# Patient Record
Sex: Female | Born: 2000 | Race: Black or African American | Hispanic: No | Marital: Single | Smoking: Never smoker
Health system: Southern US, Community
[De-identification: ages and names within clinical notes are randomized; demographics above are authoritative.]

## PROBLEM LIST (undated history)

## (undated) DIAGNOSIS — B9689 Other specified bacterial agents as the cause of diseases classified elsewhere: Secondary | ICD-10-CM

## (undated) DIAGNOSIS — N760 Acute vaginitis: Secondary | ICD-10-CM

---
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
## 2020-02-25 DIAGNOSIS — F319 Bipolar disorder, unspecified: Secondary | ICD-10-CM | POA: Diagnosis not present

## 2020-03-03 ENCOUNTER — Emergency Department (HOSPITAL_COMMUNITY)
Admission: EM | Admit: 2020-03-03 | Discharge: 2020-03-03 | Disposition: A | Discharge status: Home / Self Care | Payer: Medicaid Other | Attending: Emergency Medicine | Admitting: Emergency Medicine

## 2020-03-03 ENCOUNTER — Encounter (HOSPITAL_COMMUNITY): Payer: Self-pay | Admitting: *Deleted

## 2020-03-03 ENCOUNTER — Other Ambulatory Visit: Payer: Self-pay

## 2020-03-03 DIAGNOSIS — Z5321 Procedure and treatment not carried out due to patient leaving prior to being seen by health care provider: Secondary | ICD-10-CM | POA: Diagnosis not present

## 2020-03-03 DIAGNOSIS — R42 Dizziness and giddiness: Secondary | ICD-10-CM | POA: Insufficient documentation

## 2020-03-03 LAB — CBC
HCT: 38 % (ref 36.0–46.0)
Hemoglobin: 11.9 g/dL — ABNORMAL LOW (ref 12.0–15.0)
MCH: 29.3 pg (ref 26.0–34.0)
MCHC: 31.3 g/dL (ref 30.0–36.0)
MCV: 93.6 fL (ref 80.0–100.0)
Platelets: 293 10*3/uL (ref 150–400)
RBC: 4.06 MIL/uL (ref 3.87–5.11)
RDW: 13.9 % (ref 11.5–15.5)
WBC: 4.8 10*3/uL (ref 4.0–10.5)
nRBC: 0 % (ref 0.0–0.2)

## 2020-03-03 LAB — URINALYSIS, ROUTINE W REFLEX MICROSCOPIC
Bilirubin Urine: NEGATIVE
Glucose, UA: NEGATIVE mg/dL
Hgb urine dipstick: NEGATIVE
Ketones, ur: NEGATIVE mg/dL
Leukocytes,Ua: NEGATIVE
Nitrite: NEGATIVE
Protein, ur: NEGATIVE mg/dL
Specific Gravity, Urine: 1.023 (ref 1.005–1.030)
pH: 8 (ref 5.0–8.0)

## 2020-03-03 LAB — BASIC METABOLIC PANEL
Anion gap: 10 (ref 5–15)
BUN: 11 mg/dL (ref 6–20)
CO2: 25 mmol/L (ref 22–32)
Calcium: 9.1 mg/dL (ref 8.9–10.3)
Chloride: 100 mmol/L (ref 98–111)
Creatinine, Ser: 0.95 mg/dL (ref 0.44–1.00)
GFR calc Af Amer: >60 mL/min (ref >60)
GFR calc non Af Amer: >60 mL/min (ref >60)
Glucose, Bld: 94 mg/dL (ref 70–99)
Potassium: 4.3 mmol/L (ref 3.5–5.1)
Sodium: 135 mmol/L (ref 135–145)

## 2020-03-03 LAB — I-STAT BETA HCG BLOOD, ED (MC, WL, AP ONLY): I-stat hCG, quantitative: <5 m[IU]/mL (ref <5)

## 2020-03-03 MED ORDER — SODIUM CHLORIDE 0.9% FLUSH: 3.0000 mL | Freq: Once | INTRAVENOUS | Status: DC

## 2020-03-03 NOTE — ED Triage Notes (Signed)
Pt reports dizziness on and off, headache and upper abdominal pain for about a month.

## 2020-03-29 DIAGNOSIS — F319 Bipolar disorder, unspecified: Secondary | ICD-10-CM | POA: Diagnosis not present

## 2020-04-11 ENCOUNTER — Emergency Department (HOSPITAL_COMMUNITY): Payer: Medicaid Other

## 2020-04-11 ENCOUNTER — Other Ambulatory Visit: Payer: Self-pay

## 2020-04-11 ENCOUNTER — Emergency Department (HOSPITAL_COMMUNITY)
Admission: EM | Admit: 2020-04-11 | Discharge: 2020-04-11 | Disposition: A | Discharge status: Home / Self Care | Payer: Medicaid Other | Attending: Emergency Medicine | Admitting: Emergency Medicine

## 2020-04-11 ENCOUNTER — Encounter (HOSPITAL_COMMUNITY): Payer: Self-pay | Admitting: Emergency Medicine

## 2020-04-11 DIAGNOSIS — Y999 Unspecified external cause status: Secondary | ICD-10-CM | POA: Insufficient documentation

## 2020-04-11 DIAGNOSIS — S199XXA Unspecified injury of neck, initial encounter: Secondary | ICD-10-CM | POA: Diagnosis not present

## 2020-04-11 DIAGNOSIS — Y929 Unspecified place or not applicable: Secondary | ICD-10-CM | POA: Insufficient documentation

## 2020-04-11 DIAGNOSIS — F17210 Nicotine dependence, cigarettes, uncomplicated: Secondary | ICD-10-CM | POA: Insufficient documentation

## 2020-04-11 DIAGNOSIS — M542 Cervicalgia: Secondary | ICD-10-CM | POA: Diagnosis not present

## 2020-04-11 DIAGNOSIS — M545 Low back pain: Secondary | ICD-10-CM | POA: Insufficient documentation

## 2020-04-11 DIAGNOSIS — R52 Pain, unspecified: Secondary | ICD-10-CM | POA: Diagnosis not present

## 2020-04-11 DIAGNOSIS — S3992XA Unspecified injury of lower back, initial encounter: Secondary | ICD-10-CM | POA: Diagnosis not present

## 2020-04-11 DIAGNOSIS — Y939 Activity, unspecified: Secondary | ICD-10-CM | POA: Diagnosis not present

## 2020-04-11 DIAGNOSIS — M549 Dorsalgia, unspecified: Secondary | ICD-10-CM

## 2020-04-11 DIAGNOSIS — M5489 Other dorsalgia: Secondary | ICD-10-CM | POA: Diagnosis not present

## 2020-04-11 LAB — I-STAT BETA HCG BLOOD, ED (MC, WL, AP ONLY): I-stat hCG, quantitative: <5 m[IU]/mL (ref <5)

## 2020-04-11 MED ORDER — METHOCARBAMOL 500 MG PO TABS: 500.0000 mg | ORAL_TABLET | Freq: Two times a day (BID) | ORAL | 0 refills | Status: DC

## 2020-04-11 MED ORDER — METHOCARBAMOL 500 MG PO TABS
500.0000 mg | ORAL_TABLET | Freq: Once | ORAL | Status: AC
  Administered 2020-04-11: 500 mg via ORAL
  Filled 2020-04-11: qty 1

## 2020-04-11 MED ORDER — NAPROXEN 250 MG PO TABS
500.0000 mg | ORAL_TABLET | Freq: Once | ORAL | Status: AC
  Administered 2020-04-11: 500 mg via ORAL
  Filled 2020-04-11: qty 2

## 2020-04-11 MED ORDER — NAPROXEN 500 MG PO TABS: 500.0000 mg | ORAL_TABLET | Freq: Two times a day (BID) | ORAL | 0 refills | Status: DC
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
## 2020-04-11 NOTE — Discharge Instructions (Addendum)
Take the prescribed medication as directed.  Can try using heat on the neck and back to help with soreness. Follow-up with your primary care doctor. Return to the ED for new or worsening symptoms.

## 2020-05-26 ENCOUNTER — Emergency Department (HOSPITAL_COMMUNITY)
Admission: EM | Admit: 2020-05-26 | Discharge: 2020-05-26 | Disposition: A | Discharge status: Home / Self Care | Payer: Medicaid Other | Attending: Emergency Medicine | Admitting: Emergency Medicine

## 2020-05-26 ENCOUNTER — Other Ambulatory Visit: Payer: Self-pay

## 2020-05-26 ENCOUNTER — Encounter (HOSPITAL_COMMUNITY): Payer: Self-pay | Admitting: Obstetrics and Gynecology

## 2020-05-26 DIAGNOSIS — R1084 Generalized abdominal pain: Secondary | ICD-10-CM

## 2020-05-26 DIAGNOSIS — R112 Nausea with vomiting, unspecified: Secondary | ICD-10-CM | POA: Diagnosis not present

## 2020-05-26 DIAGNOSIS — F17200 Nicotine dependence, unspecified, uncomplicated: Secondary | ICD-10-CM | POA: Insufficient documentation

## 2020-05-26 DIAGNOSIS — K920 Hematemesis: Secondary | ICD-10-CM | POA: Diagnosis present

## 2020-05-26 DIAGNOSIS — R1013 Epigastric pain: Secondary | ICD-10-CM | POA: Diagnosis not present

## 2020-05-26 LAB — URINALYSIS, ROUTINE W REFLEX MICROSCOPIC
Bilirubin Urine: NEGATIVE
Glucose, UA: NEGATIVE mg/dL
Ketones, ur: NEGATIVE mg/dL
Nitrite: NEGATIVE
Protein, ur: NEGATIVE mg/dL
RBC / HPF: >50 RBC/hpf — ABNORMAL HIGH (ref 0–5)
Specific Gravity, Urine: 1.015 (ref 1.005–1.030)
pH: 5 (ref 5.0–8.0)

## 2020-05-26 LAB — COMPREHENSIVE METABOLIC PANEL
ALT: 9 U/L (ref 0–44)
AST: 18 U/L (ref 15–41)
Albumin: 4.6 g/dL (ref 3.5–5.0)
Alkaline Phosphatase: 58 U/L (ref 38–126)
Anion gap: 10 (ref 5–15)
BUN: 10 mg/dL (ref 6–20)
CO2: 25 mmol/L (ref 22–32)
Calcium: 9.7 mg/dL (ref 8.9–10.3)
Chloride: 106 mmol/L (ref 98–111)
Creatinine, Ser: 0.68 mg/dL (ref 0.44–1.00)
GFR calc Af Amer: >60 mL/min (ref >60)
GFR calc non Af Amer: >60 mL/min (ref >60)
Glucose, Bld: 85 mg/dL (ref 70–99)
Potassium: 4.2 mmol/L (ref 3.5–5.1)
Sodium: 141 mmol/L (ref 135–145)
Total Bilirubin: 0.6 mg/dL (ref 0.3–1.2)
Total Protein: 7.3 g/dL (ref 6.5–8.1)

## 2020-05-26 LAB — CBC
HCT: 37.4 % (ref 36.0–46.0)
Hemoglobin: 12 g/dL (ref 12.0–15.0)
MCH: 29.7 pg (ref 26.0–34.0)
MCHC: 32.1 g/dL (ref 30.0–36.0)
MCV: 92.6 fL (ref 80.0–100.0)
Platelets: 336 10*3/uL (ref 150–400)
RBC: 4.04 MIL/uL (ref 3.87–5.11)
RDW: 12.9 % (ref 11.5–15.5)
WBC: 9.5 10*3/uL (ref 4.0–10.5)
nRBC: 0 % (ref 0.0–0.2)

## 2020-05-26 LAB — I-STAT BETA HCG BLOOD, ED (MC, WL, AP ONLY): I-stat hCG, quantitative: <5 m[IU]/mL (ref <5)

## 2020-05-26 LAB — LIPASE, BLOOD: Lipase: 20 U/L (ref 11–51)

## 2020-05-26 MED ORDER — DICYCLOMINE HCL 20 MG PO TABS: 20.0000 mg | ORAL_TABLET | Freq: Two times a day (BID) | ORAL | 0 refills | Status: DC

## 2020-05-26 MED ORDER — SODIUM CHLORIDE 0.9% FLUSH: 3.0000 mL | Freq: Once | INTRAVENOUS | Status: DC

## 2020-05-26 MED ORDER — PANTOPRAZOLE SODIUM 40 MG PO TBEC
40.0000 mg | DELAYED_RELEASE_TABLET | Freq: Every day | ORAL | Status: DC
  Administered 2020-05-26: 40 mg via ORAL
  Filled 2020-05-26: qty 1

## 2020-05-26 MED ORDER — DICYCLOMINE HCL 10 MG PO CAPS
10.0000 mg | ORAL_CAPSULE | Freq: Once | ORAL | Status: AC
  Administered 2020-05-26: 10 mg via ORAL
  Filled 2020-05-26: qty 1

## 2020-05-26 MED ORDER — OMEPRAZOLE 20 MG PO CPDR: 20.0000 mg | DELAYED_RELEASE_CAPSULE | Freq: Every day | ORAL | 0 refills | Status: DC

## 2020-05-26 NOTE — ED Triage Notes (Signed)
Patient reports to the ER for c/o near syncope at work yesterday and emesis with blood. Patient reports she got very dizzy yesterday and went to sit down and vomited dark red. Patient denies diarrhea.

## 2020-05-26 NOTE — ED Notes (Signed)
Pt discharged from this ED in stable condition at this time. All discharge instructions and follow up care reviewed with pt with no further questions at this time. Pt ambulatory with steady gait, clear speech.  

## 2020-05-26 NOTE — ED Provider Notes (Signed)
[facility]-EMERGENCY DEPT Provider Note   CSN: [id number] Arrival date & time: 05/26/20  0849     History Chief Complaint  Patient presents with  . Emesis  . Near Syncope    Eileen Gray is a 18 y.o. female without significant PMHx, presenting to the ED with complaint of epigastric abdominal pain that began last night when she was at work, 11pm-12am. Pain became severe and made her feel like she was going to pass out. She reports associated vomiting with the pain, emesis looked dark red but she didn't get a good look at it. Today, the pain persists, described as cramping pain in her lower abdomen that comes and goes. BM yesterday was slightly more loose than usual, though normal brown color. Reports normal appetite. Denies fever, chills, urinary sx, hx of abdominal surgeries. Pt endorses daily marijuana use. Denies alcohol use, other drugs, or frequent NSAID use.   The history is provided by the patient.       Past Medical History:  Diagnosis Date  . BV (bacterial vaginosis)     There are no problems to display for this patient.   History reviewed. No pertinent surgical history.   OB History   No obstetric history on file.     No family history on file.  Social History   Tobacco Use  . Smoking status: Current Every Day Smoker  . Smokeless tobacco: Never Used  Substance Use Topics  . Alcohol use: Not Currently  . Drug use: Yes    Types: Marijuana    Home Medications Prior to Admission medications   Medication Sig Start Date End Date Taking? Authorizing Provider  dicyclomine (BENTYL) 20 MG tablet Take 1 tablet (20 mg total) by mouth 2 (two) times daily. 05/26/20   Zaylie Gisler, [place] N, PA-C  methocarbamol (ROBAXIN) 500 MG tablet Take 1 tablet (500 mg total) by mouth 2 (two) times daily. 04/11/20   Garlon Hatchet, PA-C  metroNIDAZOLE (METROGEL) 0.75 % vaginal gel Place 1 Applicatorful vaginally 2 (two) times daily. 06/05/18   Shon Hale, MD  naproxen (NAPROSYN) 500 MG tablet Take 1 tablet (500 mg total) by mouth 2 (two) times daily with a meal. 04/11/20   Garlon Hatchet, PA-C  omeprazole (PRILOSEC) 20 MG capsule Take 1 capsule (20 mg total) by mouth daily. 05/26/20   Freddye Cardamone, [place] N, PA-C  ondansetron (ZOFRAN) 4 MG tablet Take 1 tablet (4 mg total) by mouth every 8 (eight) hours as needed for nausea or vomiting. 12/24/18   Jeanmarie Plant, MD    Allergies    Patient has no known allergies.  Review of Systems   Review of Systems  All other systems reviewed and are negative.   Physical Exam Updated Vital Signs BP 116/82 (BP Location: Right Arm)   Pulse 64   Temp 98.5 F (36.9 C) (Oral)   Resp 20   LMP 05/26/2020 (Exact Date)   SpO2 100%   Physical Exam Vitals and nursing note reviewed.  Constitutional:      General: She is not in acute distress.    Appearance: She is well-developed. She is not ill-appearing.  HENT:     Head: Normocephalic and atraumatic.  Eyes:     Conjunctiva/sclera: Conjunctivae normal.  Cardiovascular:     Rate and Rhythm: Normal rate and regular rhythm.  Pulmonary:     Effort: Pulmonary effort is normal. No respiratory distress.     Breath sounds: Normal breath sounds.  Abdominal:  General: Bowel sounds are normal.     Palpations: Abdomen is soft.     Tenderness: There is generalized abdominal tenderness. There is no guarding or rebound.  Skin:    General: Skin is warm.  Neurological:     Mental Status: She is alert.  Psychiatric:        Behavior: Behavior normal.     ED Results / Procedures / Treatments   Labs (all labs ordered are listed, but only abnormal results are displayed) Labs Reviewed  URINALYSIS, ROUTINE W REFLEX MICROSCOPIC - Abnormal; Notable for the following components:      Result Value   APPearance HAZY (*)    Hgb urine dipstick LARGE (*)    Leukocytes,Ua TRACE (*)    RBC / HPF >50 (*)    Bacteria, UA RARE (*)    All other components within  normal limits  LIPASE, BLOOD  COMPREHENSIVE METABOLIC PANEL  CBC  I-STAT BETA HCG BLOOD, ED ([facility], [facility], [facility] ONLY)    EKG None  Radiology No results found.  Procedures Procedures (including critical care time)  Medications Ordered in ED Medications  dicyclomine (BENTYL) capsule 10 mg (10 mg Oral Given 05/26/20 1655)    ED Course  I have reviewed the triage vital signs and the nursing notes.  Pertinent labs & imaging results that were available during my care of the patient were reviewed by me and considered in my medical decision making (see chart for details).    MDM Rules/Calculators/A&P                          Patient presenting with 2 episodes of hematemesis that occurred late last night with abdominal cramping.  She has had no further episodes, and is having some waves of abdominal cramping today.  Abdomen has generalized tenderness, no guarding or rebound is present.  She is very well-appearing and does not appear to be in distress.  Vital signs are normal, afebrile.  Labs are also very reassuring, no leukocytosis or anemia.  Metabolic panel is normal.  hCG is negative.  Urine appears to have some contamination though patient has no symptoms for urinary tract infection, do not leave treatment of asymptomatic bacteriuria is indicated at this time.  Discussed possibility of small Mallory-Weiss tear in the setting of the vomiting and retching, versus gastritis versus PUD versus gastroenteritis.  She is given PPI, Bentyl for symptoms,, tolerating p.o. will discharge with symptomatic management, PPI, instructions to follow closely with with PCP.  She is given strict return precautions regarding persistent bleeding, or worsening pain.  Patient is agreeable to plan, well-appearing, safe for discharge.  Final Clinical Impression(s) / ED Diagnoses Final diagnoses:  Generalized abdominal pain  Hematemesis with nausea    Rx / DC Orders ED Discharge Orders         Ordered    dicyclomine  (BENTYL) 20 MG tablet  2 times daily     Discontinue  Reprint     05/26/20 1737    omeprazole (PRILOSEC) 20 MG capsule  Daily     Discontinue  Reprint     05/26/20 1737           Aariana Shankland, [place] N, PA-C 05/26/20 2339    Virgina Norfolk, DO 05/27/20 0037

## 2020-05-26 NOTE — Discharge Instructions (Addendum)
Please read instructions below. Drink clear liquids until your stomach feels better. Then, slowly introduce bland foods into your diet as tolerated, such as bread, rice, apples, bananas. You can take bentyl every 12 hours as needed for abdominal cramping. Take the omeprazole daily for at least 1 week to help with stomach irritation and bleeding. Follow up with your primary care. If you  Return to the ER for severely worsening abdominal pain, fever, uncontrollable vomiting with large amount of blood, or if you have persisting dark tarry stools.

## 2020-05-31 ENCOUNTER — Telehealth: Payer: Self-pay | Admitting: *Deleted

## 2020-05-31 DIAGNOSIS — Z9189 Other specified personal risk factors, not elsewhere classified: Secondary | ICD-10-CM

## 2020-05-31 NOTE — Telephone Encounter (Addendum)
  Transition Care Management Follow-up Telephone Call  . Medicaid Managed Care Transition Call Status:MM [facility] Call Made  . Date of discharge and from where: [facility], 05/26/20  . How have you been since you were released from the hospital? "much better"  . Any questions or concerns? No  Items Reviewed: Marland Kitchen Did the pt receive and understand the discharge instructions provided? Yes  . Medications obtained and verified? Yes  . Any new allergies since your discharge? No  . Dietary orders reviewed? Yes . Do you have support at home? yes Functional Questionnaire: (I = Independent and D = Dependent)  ADLs: Independent Bathing/Dressing:Independent Meal Prep: Independent Eating: Independent Maintaining continence: Independent Transferring/Ambulation: Independent Managing Meds: Independent Follow up appointments reviewed:  PCP Hospital f/u appt confirmed? No  per discharge instructions pt given address and number to contact [facility] ASAP  [facility] f/u appt confirmed? n/a  Are transportation arrangements needed? no  If their condition worsens, is the pt aware to call PCP or go to the EmergencyDept.? Yes  Was the patient provided with contact information for the PCP's office or ED? yes  Was to pt encouraged to call back with questions or concerns? Yes  [facility] for [facility] for new PCP  Burnard Bunting, RN, BSN, CCRN Patient Engagement Center [phone]

## 2020-06-06 DIAGNOSIS — Z03818 Encounter for observation for suspected exposure to other biological agents ruled out: Secondary | ICD-10-CM | POA: Diagnosis not present

## 2020-06-06 DIAGNOSIS — Z20822 Contact with and (suspected) exposure to covid-19: Secondary | ICD-10-CM | POA: Diagnosis not present

## 2020-06-15 ENCOUNTER — Other Ambulatory Visit: Payer: Self-pay | Admitting: *Deleted
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
## 2020-06-20 ENCOUNTER — Ambulatory Visit: Payer: Self-pay | Admitting: *Deleted

## 2020-06-30 ENCOUNTER — Other Ambulatory Visit: Payer: Self-pay | Admitting: *Deleted

## 2020-06-30 NOTE — Patient Outreach (Signed)
[facility]) Care Management  06/30/2020  Eileen Gray 11-16-2000 [id number]   Screening Managed [facility]  RN spoke with pt's legal guardian [name] [phone]). RN introduced [facility] services and the purpose for today's call. Requested a call back tomorrow for further information on possible services for this pt.   Plan: Will follow up tomorrow as requested.  Elliot Cousin, RN Care Management Coordinator [facility] Main Office [phone]

## 2020-07-01 ENCOUNTER — Other Ambulatory Visit: Payer: Self-pay | Admitting: *Deleted

## 2020-07-01 NOTE — Patient Outreach (Signed)
[facility]) Care Management  07/01/2020  Eileen Gray [birth year] [id number]   Telephone Assessment  Guardian requested a call back today however RN unsuccessful with the outreach call. RN able to leave a HIPAA approved voice message requesting a call back.   Plan: Will follow up with another outreach next within the next 4 days for pending services.  Elliot Cousin, RN Care Management Coordinator [facility] Main Office [phone]

## 2020-07-05 ENCOUNTER — Other Ambulatory Visit: Payer: Self-pay | Admitting: *Deleted

## 2020-07-05 NOTE — Patient Outreach (Signed)
[facility]) Care Management  07/05/2020  Tonnie Stillman [birth year] [id number]   Outreach #3 RN attempted another outreach call however remains unsuccessful with the guardian sister Agustin Cree).  RN able to leave a HIPAA approved voice message requesting a call back.  Plan: Will allow time for a call back for pending services and send outreach letter to pt.  Elliot Cousin, RN Care Management Coordinator [facility] Main Office [phone]

## 2020-07-06 ENCOUNTER — Ambulatory Visit: Payer: Self-pay | Admitting: *Deleted
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
## 2020-08-05 ENCOUNTER — Other Ambulatory Visit: Payer: Self-pay

## 2020-08-05 ENCOUNTER — Ambulatory Visit: Payer: No Typology Code available for payment source | Attending: Nurse Practitioner | Admitting: Nurse Practitioner

## 2020-09-07 DIAGNOSIS — N898 Other specified noninflammatory disorders of vagina: Secondary | ICD-10-CM | POA: Diagnosis not present

## 2020-10-02 DIAGNOSIS — N898 Other specified noninflammatory disorders of vagina: Secondary | ICD-10-CM | POA: Diagnosis not present

## 2020-10-02 DIAGNOSIS — A749 Chlamydial infection, unspecified: Secondary | ICD-10-CM | POA: Diagnosis not present

## 2020-10-26 DIAGNOSIS — N898 Other specified noninflammatory disorders of vagina: Secondary | ICD-10-CM | POA: Diagnosis not present

## 2020-11-25 DIAGNOSIS — Z309 Encounter for contraceptive management, unspecified: Secondary | ICD-10-CM | POA: Diagnosis not present

## 2020-11-29 ENCOUNTER — Ambulatory Visit (HOSPITAL_COMMUNITY)
Admission: EM | Admit: 2020-11-29 | Discharge: 2020-11-29 | Disposition: A | Discharge status: Home / Self Care | Payer: Medicaid Other | Attending: Urgent Care | Admitting: Urgent Care

## 2020-11-29 ENCOUNTER — Encounter (HOSPITAL_COMMUNITY): Payer: Self-pay

## 2020-11-29 DIAGNOSIS — N760 Acute vaginitis: Secondary | ICD-10-CM | POA: Diagnosis not present

## 2020-11-29 LAB — POC URINE PREG, ED: Preg Test, Ur: NEGATIVE

## 2020-11-29 MED ORDER — TERCONAZOLE 0.8 % VA CREA: 1.0000 | TOPICAL_CREAM | Freq: Every day | VAGINAL | 0 refills | Status: DC

## 2020-11-29 NOTE — ED Provider Notes (Signed)
[facility] - URGENT CARE CENTER   MRN: [id number] DOB: 2000-12-14  Subjective:   Eileen Gray is a 20 y.o. female presenting for 2-day history of vaginal itching and stinging sensation. Denies fever, nausea, vomiting, vaginal discharge, genital rash, dysuria, urinary frequency. Patient is sexually active, does not always use condoms for protection. She is mostly concerned about a yeast infection as she has a history of this and feels exactly the same. She also has a history of BV and remote history of gonorrhea. She is not on birth control now, plans on getting on Depo injections soon.  No current facility-administered medications for this encounter.  Current Outpatient Medications:  .  omeprazole (PRILOSEC) 20 MG capsule, Take 1 capsule (20 mg total) by mouth daily., Disp: 14 capsule, Rfl: 0 .  ondansetron (ZOFRAN) 4 MG tablet, Take 1 tablet (4 mg total) by mouth every 8 (eight) hours as needed for nausea or vomiting., Disp: 8 tablet, Rfl: 0   No Known Allergies  Past Medical History:  Diagnosis Date  . BV (bacterial vaginosis)      History reviewed. No pertinent surgical history.  History reviewed. No pertinent family history.  Social History   Tobacco Use  . Smoking status: Current Every Day Smoker  . Smokeless tobacco: Never Used  Substance Use Topics  . Alcohol use: Not Currently  . Drug use: Yes    Frequency: 1.0 times per week    Types: Marijuana    Comment: every day     ROS   Objective:   Vitals: BP 111/73 (BP Location: Right Arm)   Pulse 81   Temp 98 F (36.7 C) (Oral)   Resp 18   Ht 5\' 2"  (1.575 m)   Wt 103 lb (46.7 kg)   LMP 11/10/2020   SpO2 100%   BMI 18.84 kg/m   Physical Exam Constitutional:      General: She is not in acute distress.    Appearance: Normal appearance. She is well-developed. She is not ill-appearing, toxic-appearing or diaphoretic.  HENT:     Head: Normocephalic and atraumatic.     Nose: Nose normal.     Mouth/Throat:      Mouth: Mucous membranes are moist.     Pharynx: Oropharynx is clear.  Eyes:     General: No scleral icterus.    Extraocular Movements: Extraocular movements intact.     Pupils: Pupils are equal, round, and reactive to light.  Cardiovascular:     Rate and Rhythm: Normal rate.  Pulmonary:     Effort: Pulmonary effort is normal.  Abdominal:     General: Bowel sounds are normal. There is no distension.     Palpations: Abdomen is soft. There is no mass.     Tenderness: There is no abdominal tenderness. There is no right CVA tenderness, left CVA tenderness, guarding or rebound.  Skin:    General: Skin is warm and dry.  Neurological:     General: No focal deficit present.     Mental Status: She is alert and oriented to person, place, and time.  Psychiatric:        Mood and Affect: Mood normal.        Behavior: Behavior normal.        Thought Content: Thought content normal.        Judgment: Judgment normal.     Results for orders placed or performed during the hospital encounter of 11/29/20 (from the past 24 hour(s))  POC urine pregnancy  Status: None   Collection Time: 11/29/20 10:54 AM  Result Value Ref Range   Preg Test, Ur NEGATIVE NEGATIVE    Assessment and Plan :   PDMP not reviewed this encounter.  1. Acute vaginitis     Patient requested topical treatment, will use terconazole. Labs pending. Counseled patient on potential for adverse effects with medications prescribed/recommended today, ER and return-to-clinic precautions discussed, patient verbalized understanding.    Wallis Bamberg, PA-C 11/29/20 1223

## 2020-11-29 NOTE — ED Triage Notes (Signed)
Pt presents with discomfort in vaginal aera for 2 days. Patient would like to be tested for STDs and potiental yeast infection.

## 2020-11-30 LAB — CERVICOVAGINAL ANCILLARY ONLY
Bacterial Vaginitis (gardnerella): NEGATIVE
Candida Glabrata: NEGATIVE
Candida Vaginitis: POSITIVE — AB
Chlamydia: NEGATIVE
Comment: NEGATIVE
Comment: NEGATIVE
Comment: NEGATIVE
Comment: NEGATIVE
Comment: NEGATIVE
Comment: NORMAL
Neisseria Gonorrhea: NEGATIVE
Trichomonas: NEGATIVE

## 2020-12-01 DIAGNOSIS — N898 Other specified noninflammatory disorders of vagina: Secondary | ICD-10-CM | POA: Diagnosis not present

## 2020-12-01 DIAGNOSIS — Z8619 Personal history of other infectious and parasitic diseases: Secondary | ICD-10-CM | POA: Diagnosis not present

## 2020-12-14 DIAGNOSIS — Z3009 Encounter for other general counseling and advice on contraception: Secondary | ICD-10-CM | POA: Diagnosis not present

## 2020-12-30 DIAGNOSIS — N898 Other specified noninflammatory disorders of vagina: Secondary | ICD-10-CM | POA: Diagnosis not present

## 2021-01-03 ENCOUNTER — Other Ambulatory Visit: Payer: Self-pay

## 2021-01-03 DIAGNOSIS — Z975 Presence of (intrauterine) contraceptive device: Secondary | ICD-10-CM | POA: Insufficient documentation

## 2021-01-03 DIAGNOSIS — Z30017 Encounter for initial prescription of implantable subdermal contraceptive: Secondary | ICD-10-CM | POA: Diagnosis not present

## 2021-01-03 NOTE — Patient Outreach (Signed)
Care Coordination  01/03/2021  Eileen Gray 08/05/2001 [id number]   Medicaid Managed Care   Unsuccessful Outreach Note  01/03/2021 Name: Eileen Gray MRN: [id number] DOB: [birth year]  Referred by: Patient, No Pcp Per Reason for referral : High Risk Managed Medicaid (HR MM Unsuccessful Telephone Outreach)   An unsuccessful telephone outreach was attempted today. The patient was referred to the case management team for assistance with care management and care coordination.   Follow Up Plan: The care management team will reach out to the patient again over the next 7 days.   Gus Puma, BSW, [place] [facility]  [place]  High Risk Managed Medicaid Team

## 2021-01-03 NOTE — Patient Instructions (Signed)
Visit Information  Ms. Eileen Gray  - as a part of your Medicaid benefit, you are eligible for care management and care coordination services at no cost or copay. I was unable to reach you by phone today but would be happy to help you with your health related needs. Please feel free to call me @ [phone].   A member of the Managed Medicaid care management team will reach out to you again over the next 7 days.   Gus Puma, BSW, [place] [facility]    High Risk Managed Medicaid Team

## 2021-01-12 ENCOUNTER — Other Ambulatory Visit: Payer: Self-pay

## 2021-01-12 NOTE — Patient Instructions (Signed)
Visit Information  Ms. Scheerer was given information about Medicaid Managed Care team care coordination services as a part of their [facility] Community Plan Medicaid benefit. Jamil Castillo verbally consented to engagement with the [facility] Managed Care team.   For questions related to your [facility], please call: [phone] or visit the homepage here: [email or web address]  If you would like to schedule transportation through your [facility], please call the following number at least 2 days in advance of your appointment: [phone].   Ms. Kahler - following are the goals we discussed in your visit today:  Goals Addressed   None      Social Worker will follow up in 30 ays.   Shaune Leeks  Following is a copy of your plan of care:  There are no care plans to display for this patient.

## 2021-01-12 NOTE — Patient Outreach (Signed)
  Medicaid Managed Care Social Work Note  01/12/2021 Name:  Eileen Gray MRN:  [id number] DOB:  27-Jul-2001  Eileen Gray is an 20 y.o. year old female who is a primary Eileen Gray of Eileen Gray, No Pcp Per.  The [facility] Managed Care Coordination team was consulted for assistance with:  No PCP  Ms. Dysert was given information about Medicaid Managed CareCoordination services today. Jenene Slicker agreed to services and verbal consent obtained.  Engaged with Eileen Gray  for by telephone forinitial visit in response to referral for case management and/or care coordination services.   Assessments/Interventions:  Review of past medical history, allergies, medications, health status, including review of consultants reports, laboratory and other test data, was performed as part of comprehensive evaluation and provision of chronic care management services.  SDOH: (Social Determinant of Health) assessments and interventions performed:   BSW provided Eileen Gray with the information for [facility] [phone], [address] [place], [place], [place], to schedule a new Eileen Gray appointment.  BSW will check back with Eileen Gray in 30 days.  Advanced Directives Status:  Not addressed in this encounter.  Care Plan                 No Known Allergies  Medications Reviewed Today    Reviewed by Lesli Albee, RN (Registered Nurse) on 11/29/20 at 1016  Med List Status: <None>  Medication Order Taking? Sig Documenting Provider Last Dose Status Informant  dicyclomine (BENTYL) 20 MG tablet [id number]  Take 1 tablet (20 mg total) by mouth 2 (two) times daily. Robinson, [place] N, PA-C  Active   methocarbamol (ROBAXIN) 500 MG tablet [id number]  Take 1 tablet (500 mg total) by mouth 2 (two) times daily. Garlon Hatchet, PA-C  Active   metroNIDAZOLE (METROGEL) 0.75 % vaginal gel [id number]  Place 1 Applicatorful vaginally 2 (two) times daily. Shon Hale, MD  Active   naproxen (NAPROSYN) 500 MG tablet [id number]  Take 1 tablet (500  mg total) by mouth 2 (two) times daily with a meal. Garlon Hatchet, PA-C  Active   omeprazole (PRILOSEC) 20 MG capsule [id number]  Take 1 capsule (20 mg total) by mouth daily. Robinson, [place] N, PA-C  Active   ondansetron (ZOFRAN) 4 MG tablet [id number]  Take 1 tablet (4 mg total) by mouth every 8 (eight) hours as needed for nausea or vomiting. Jeanmarie Plant, MD  Active           There are no problems to display for this Eileen Gray.   Conditions to be addressed/monitored per PCP order:  PCP  There are no care plans that you recently modified to display for this Eileen Gray.   Follow up:  Eileen Gray agrees to Care Plan and Follow-up.  Plan: The Managed Medicaid care management team will reach out to the Eileen Gray again over the next 30 days.  Date/time of next scheduled Social Work care management/care coordination outreach:  02/13/21  Gus Puma, BSW, [facility]  [place]  High Risk Managed Medicaid Team

## 2021-01-19 ENCOUNTER — Other Ambulatory Visit: Payer: Self-pay

## 2021-01-19 ENCOUNTER — Encounter: Payer: Self-pay | Admitting: Nurse Practitioner

## 2021-01-19 ENCOUNTER — Ambulatory Visit (INDEPENDENT_AMBULATORY_CARE_PROVIDER_SITE_OTHER): Payer: Medicaid Other | Admitting: Nurse Practitioner

## 2021-01-19 VITALS — BP 105/63 | HR 87 | Ht 62.0 in | Wt 104.0 lb

## 2021-01-19 DIAGNOSIS — Z7689 Persons encountering health services in other specified circumstances: Secondary | ICD-10-CM

## 2021-01-19 NOTE — Progress Notes (Signed)
[facility] Patient [facility] [address] [place], [place]  [address] Phone:  [phone]   Fax:  [phone]   New Patient Office Visit  Subjective:  Patient ID: Eileen Gray, female    DOB: 2001/08/30  Age: 20 y.o. MRN: [id number]  CC:  Chief Complaint  Patient presents with  . Establish Care    HPI Eileen Gray presents to establish care. She  has a past medical history of BV (bacterial vaginosis).   She is originally from [place]. She is attending school and work. [employer]. 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    Past Medical History:  Diagnosis Date  . BV (bacterial vaginosis)     History reviewed. No pertinent surgical history.  History reviewed. No pertinent family history.  Social History   Socioeconomic History  . Marital status: Single    Spouse name: Not on file  . Number of children: Not on file  . Years of education: Not on file  . Highest education level: Not on file  Occupational History  . Not on file  Tobacco Use  . Smoking status: Never Smoker  . Smokeless tobacco: Never Used  Substance and Sexual Activity  . Alcohol use: Not Currently  . Drug use: Yes    Frequency: 1.0 times per week    Types: Marijuana    Comment: every day   . Sexual activity: Yes    Birth control/protection: None  Other Topics Concern  . Not on file  Social History Narrative  . Not on file   Social Determinants of Health   Financial Resource Strain: Low Risk   . Difficulty of Paying Living Expenses: Not hard at all  Food Insecurity: No Food Insecurity  . Worried About Programme researcher, broadcasting/film/video in the Last Year: Never true  . Ran Out of Food in the Last Year: Never true  Transportation Needs: No Transportation Needs  . Lack of Transportation (Medical): No  . Lack of Transportation (Non-Medical): No  Physical Activity: Inactive  . Days of Exercise per Week: 0 days  . Minutes of Exercise per Session: 0 min  Stress: No Stress  Concern Present  . Feeling of Stress : Not at all  Social Connections: Unknown  . Frequency of Communication with Friends and Family: Once a week  . Frequency of Social Gatherings with Friends and Family: Once a week  . Attends Religious Services: Never  . Active Member of Clubs or Organizations: No  . Attends Banker Meetings: 1 to 4 times per year  . Marital Status: Not on file  Intimate Partner Violence: Not At Risk  . Fear of Current or Ex-Partner: No  . Emotionally Abused: No  . Physically Abused: No  . Sexually Abused: No    ROS Review of Systems  Constitutional: Negative.   HENT: Negative.   Eyes: Negative.   Respiratory: Negative.  Negative for chest tightness and shortness of breath.        Smoke   Cardiovascular: Negative for chest pain.  Gastrointestinal: Negative for constipation and diarrhea.  Endocrine: Negative.   Genitourinary: Negative.  Negative for frequency.  Musculoskeletal: Negative.   Skin: Negative.   Allergic/Immunologic: Negative.   Neurological: Positive for headaches. Negative for dizziness.  Hematological: Negative.     Objective:   Today's Vitals: BP 105/63   Pulse 87   Ht 5\' 2"  (1.575 m)   Wt 104 lb (47.2 kg)   SpO2 100%  BMI 19.02 kg/m   Physical Exam HENT:     Head: Normocephalic and atraumatic.     Right Ear: Tympanic membrane normal.     Left Ear: Tympanic membrane normal.     Nose: Nose normal.     Mouth/Throat:     Mouth: Mucous membranes are moist.  Cardiovascular:     Rate and Rhythm: Normal rate and regular rhythm.     Pulses: Normal pulses.     Heart sounds: Normal heart sounds.  Pulmonary:     Effort: Pulmonary effort is normal.     Breath sounds: Normal breath sounds.  Abdominal:     General: Abdomen is flat.     Palpations: Abdomen is soft.     Comments: hypoactive  Musculoskeletal:        General: Normal range of motion.  Skin:    General: Skin is warm and dry.     Capillary Refill: Capillary  refill takes less than 2 seconds.  Neurological:     General: No focal deficit present.     Mental Status: She is alert and oriented to person, place, and time.  Psychiatric:        Mood and Affect: Mood normal.        Behavior: Behavior normal.        Thought Content: Thought content normal.        Judgment: Judgment normal.     Assessment & Plan:   Problem List Items Addressed This Visit   None   Visit Diagnoses    Encounter to establish care    -  Primary Discussed female health maintenance; SBE, annual CBE, PAP test Discussed general safety in vehicle and COVID Discussed regular hydration with water Discussed healthy diet and exercise and weight management Discussed sexual health  Discussed mental health Encouraged to call our office for an appointment with in ongoing concerns for questions.       Outpatient Encounter Medications as of 01/19/2021  Medication Sig  . Etonogestrel (NEXPLANON [place]) Inject into the skin.  . [DISCONTINUED] dicyclomine (BENTYL) 20 MG tablet Take 1 tablet (20 mg total) by mouth 2 (two) times daily.  . [DISCONTINUED] omeprazole (PRILOSEC) 20 MG capsule Take 1 capsule (20 mg total) by mouth daily.  . [DISCONTINUED] ondansetron (ZOFRAN) 4 MG tablet Take 1 tablet (4 mg total) by mouth every 8 (eight) hours as needed for nausea or vomiting.  . [DISCONTINUED] terconazole (TERAZOL 3) 0.8 % vaginal cream Place 1 applicator vaginally at bedtime.   No facility-administered encounter medications on file as of 01/19/2021.    Follow-up: Return in about 1 year (around 01/19/2022), or and as needed.   Barbette Merino, NP

## 2021-01-19 NOTE — Patient Instructions (Addendum)
Health Maintenance, Female Adopting a healthy lifestyle and getting preventive care are important in promoting health and wellness. Ask your health care provider about:  The right schedule for you to have regular tests and exams.  Things you can do on your own to prevent diseases and keep yourself healthy. What should I know about diet, weight, and exercise? Eat a healthy diet  Eat a diet that includes plenty of vegetables, fruits, low-fat dairy products, and lean protein.  Do not eat a lot of foods that are high in solid fats, added sugars, or sodium.   Maintain a healthy weight Body mass index (BMI) is used to identify weight problems. It estimates body fat based on height and weight. Your health care provider can help determine your BMI and help you achieve or maintain a healthy weight. Get regular exercise Get regular exercise. This is one of the most important things you can do for your health. Most adults should:  Exercise for at least 150 minutes each week. The exercise should increase your heart rate and make you sweat (moderate-intensity exercise).  Do strengthening exercises at least twice a week. This is in addition to the moderate-intensity exercise.  Spend less time sitting. Even light physical activity can be beneficial. Watch cholesterol and blood lipids Have your blood tested for lipids and cholesterol at 20 years of age, then have this test every 5 years. Have your cholesterol levels checked more often if:  Your lipid or cholesterol levels are high.  You are older than 21 years of age.  You are at high risk for heart disease. What should I know about cancer screening? Depending on your health history and family history, you may need to have cancer screening at various ages. This may include screening for:  Breast cancer.  Cervical cancer.  Colorectal cancer.  Skin cancer.  Lung cancer. What should I know about heart disease, diabetes, and high blood  pressure? Blood pressure and heart disease  High blood pressure causes heart disease and increases the risk of stroke. This is more likely to develop in people who have high blood pressure readings, are of African descent, or are overweight.  Have your blood pressure checked: ? Every 3-5 years if you are 18-39 years of age. ? Every year if you are 40 years old or older. Diabetes Have regular diabetes screenings. This checks your fasting blood sugar level. Have the screening done:  Once every three years after age 40 if you are at a normal weight and have a low risk for diabetes.  More often and at a younger age if you are overweight or have a high risk for diabetes. What should I know about preventing infection? Hepatitis B If you have a higher risk for hepatitis B, you should be screened for this virus. Talk with your health care provider to find out if you are at risk for hepatitis B infection. Hepatitis C Testing is recommended for:  Everyone born from 1945 through 1965.  Anyone with known risk factors for hepatitis C. Sexually transmitted infections (STIs)  Get screened for STIs, including gonorrhea and chlamydia, if: ? You are sexually active and are younger than 21 years of age. ? You are older than 21 years of age and your health care provider tells you that you are at risk for this type of infection. ? Your sexual activity has changed since you were last screened, and you are at increased risk for chlamydia or gonorrhea. Ask your health care provider   if you are at risk.  Ask your health care provider about whether you are at high risk for HIV. Your health care provider may recommend a prescription medicine to help prevent HIV infection. If you choose to take medicine to prevent HIV, you should first get tested for HIV. You should then be tested every 3 months for as long as you are taking the medicine. Pregnancy  If you are about to stop having your period (premenopausal) and  you may become pregnant, seek counseling before you get pregnant.  Take 400 to 800 micrograms (mcg) of folic acid every day if you become pregnant.  Ask for birth control (contraception) if you want to prevent pregnancy. Osteoporosis and menopause Osteoporosis is a disease in which the bones lose minerals and strength with aging. This can result in bone fractures. If you are 4 years old or older, or if you are at risk for osteoporosis and fractures, ask your health care provider if you should:  Be screened for bone loss.  Take a calcium or vitamin D supplement to lower your risk of fractures.  Be given hormone replacement therapy (HRT) to treat symptoms of menopause. Follow these instructions at home: Lifestyle  Do not use any products that contain nicotine or tobacco, such as cigarettes, e-cigarettes, and chewing tobacco. If you need help quitting, ask your health care provider.  Do not use street drugs.  Do not share needles.  Ask your health care provider for help if you need support or information about quitting drugs. Alcohol use  Do not drink alcohol if: ? Your health care provider tells you not to drink. ? You are pregnant, may be pregnant, or are planning to become pregnant.  If you drink alcohol: ? Limit how much you use to 0-1 drink a day. ? Limit intake if you are breastfeeding.  Be aware of how much alcohol is in your drink. In the [place] one drink equals one 12 oz bottle of beer (355 mL), one 5 oz glass of wine (148 mL), or one 1 oz glass of hard liquor (44 mL). General instructions  Schedule regular health, dental, and eye exams.  Stay current with your vaccines.  Tell your health care provider if: ? You often feel depressed. ? You have ever been abused or do not feel safe at home. Summary  Adopting a healthy lifestyle and getting preventive care are important in promoting health and wellness.  Follow your health care provider's instructions about healthy  diet, exercising, and getting tested or screened for diseases.  Follow your health care provider's instructions on monitoring your cholesterol and blood pressure. This information is not intended to replace advice given to you by your health care provider. Make sure you discuss any questions you have with your health care provider. Document Revised: 10/22/2018 Document Reviewed: 10/22/2018 Elsevier Patient Education  2021 Elsevier Inc.   HPV Vaccine Information for Parents  HPV (human papillomavirus) is a common virus that spreads easily from person to person through skin-to-skin or sexual contact. There are many types of HPV viruses. They can cause warts in the genitals (genital or mucosal HPV), or on the hands or feet (cutaneous or nonmucosal HPV). Some genital HPV types are considered high-risk and may cause cancer. Your child can get a vaccination to help prevent certain HPV infections that can cause cancer as well as those types that cause genital and anal warts. The vaccine is safe and effective. It is recommended for boys and girls at about  42-68 years of age. Getting the vaccine at this age (before he or she is sexually active) gives your child the best protection from HPV infection through adulthood. How can HPV affect my child? HPV infection can cause:  Genital warts.  Mouth or throat cancer.  Anal cancer.  Cervical, vulvar, or vaginal cancer.  Penile cancer. During pregnancy, HPV infection can be passed to the baby. This infection can cause warts to develop in a baby's throat and windpipe. What actions can I take to lower my child's risk for HPV? To lower your child's risk for genital HPV infection, have him or her get the HPV vaccine before becoming sexually active. The best time for vaccination is between ages 31 and 49, though it can be given to children as young as 53 years old. If your child gets the first dose before age 56, the vaccination can be given as 2 shots, 6-12  months apart. In some situations, 3 doses are needed.  If your child starts the vaccine before age 72 but does not have a second dose within 6-12 months after the first dose, he or she will need 3 doses to complete the vaccination. When your child has the first dose, it is important to make an appointment for the next shot and keep the appointment.  Teens who are not vaccinated before age 41 will need 3 doses, within six months of the first dose.  If your child has a weak immune system, he or she may need 3 doses. Young adults can also get the vaccination, even if they are already sexually active and even if they have already been infected with HPV. The vaccination can still help prevent the types of cancer-causing HPV that a person has not been infected with. What are the risks and benefits of the HPV vaccine? Benefits The main benefit of getting vaccinated is to prevent certain cancers, including:  Cervical, vulvar, and vaginal cancer in females.  Penile cancer in males.  Oral and anal cancer in both males and females. The risk of these cancers is lower if your child gets vaccinated before he or she becomes sexually active. The vaccine also prevents genital warts caused by HPV. Risks The risks, although low, include side effects or reactions to the vaccine. Very few reactions have been reported, but they can include:  Soreness, redness, or swelling at the injection site.  Dizziness or headache.  Fever. Who should not get the HPV vaccine or should wait to get it? Some children should not get the HPV vaccine or should wait. Discuss the risks and benefits of the vaccine with your child's health care provider if your child:  Has had a severe allergic reaction to other vaccinations.  Is allergic to yeast.  Has a fever.  Has had a recent illness.  Is pregnant or may be pregnant. Where to find more information  Centers for Disease Control and Prevention: https://www.mckee-gibson.com/  American  Academy of Pediatrics: healthychildren.org Summary  HPV (human papillomavirus) is a common virus that spreads from person to person through skin-to-skin or sexual contact. It can spread during vaginal, anal, or oral sex.  Your child can get a vaccination to prevent HPV infection and cancer. It is best to get the vaccination before becoming sexually active.  The HPV vaccine can protect your child from genital warts and certain types of cancer, including cancer of the cervix, throat, mouth, vulva, vagina, anus, and penis.  The HPV vaccine is both safe and effective.  The  best time for boys and girls to get the vaccination is when they are between ages 63 and 22. This information is not intended to replace advice given to you by your health care provider. Make sure you discuss any questions you have with your health care provider. Document Revised: 07/05/2020 Document Reviewed: 06/14/2020 Elsevier Patient Education  2021 [employer].

## 2021-02-13 ENCOUNTER — Other Ambulatory Visit: Payer: Self-pay

## 2021-02-13 NOTE — Patient Instructions (Signed)
Visit Information  Eileen Gray was given information about Medicaid Managed Care team care coordination services as a part of their [facility] Community Plan Medicaid benefit. Eileen Gray verbally consented to engagement with the [facility] Managed Care team.   For questions related to your [facility], please call: [phone] or visit the homepage here: [email or web address]  If you would like to schedule transportation through your [facility], please call the following number at least 2 days in advance of your appointment: [phone].   Call the Behavioral Health Crisis Line at [phone], at any time, 24 hours a day, 7 days a week. If you are in danger or need immediate medical attention call 911.  Eileen Gray - following are the goals we discussed in your visit today:  Goals Addressed   None     The patient has been provided with contact information for the Managed Medicaid care management team and has been advised to call with any health related questions or concerns.   Eileen Gray, BSW, [place] [facility]  [place]  High Risk Managed Medicaid Team    Following is a copy of your plan of care:  There are no care plans to display for this patient.

## 2021-02-13 NOTE — Patient Outreach (Signed)
  Medicaid Managed Care Social Work Note  02/13/2021 Name:  Eileen Gray MRN:  [id number] DOB:  January 26, 2001  Eileen Gray is an 20 y.o. year old female who is a primary patient of Barbette Merino, NP.  The [facility] Managed Care Coordination team was consulted for assistance with:  PCP  Ms. Gambrill was given information about Medicaid Managed CareCoordination services today. Jenene Slicker agreed to services and verbal consent obtained.  Engaged with patient  for by telephone forfollow up visit in response to referral for case management and/or care coordination services.   Assessments/Interventions:  Review of past medical history, allergies, medications, health status, including review of consultants reports, laboratory and other test data, was performed as part of comprehensive evaluation and provision of chronic care management services.  SDOH: (Social Determinant of Health) assessments and interventions performed:   BSW contacted patient to follow up with PCP appointment. Patient stated she did attend the appointment and will be going back.  Advanced Directives Status:  Not addressed in this encounter.  Care Plan                 No Known Allergies  Medications Reviewed Today    Reviewed by Barbette Merino, NP (Nurse Practitioner) on 01/19/21 at 1350  Med List Status: <None>  Medication Order Taking? Sig Documenting Provider Last Dose Status Informant        Discontinued 11/29/20 1056   Etonogestrel (NEXPLANON [place]) [id number] Yes Inject into the skin. [provider] Taking Active           Patient Active Problem List   Diagnosis Date Noted  . Nexplanon in place 01/03/2021    Conditions to be addressed/monitored per PCP order:  PCP  There are no care plans that you recently modified to display for this patient.   Follow up:  Patient requests no follow-up at this time.  Plan: The patient has been provided with contact information for the Managed Medicaid care  management team and has been advised to call with any health related questions or concerns.    Gus Puma, BSW, [facility]  [place]  High Risk Managed Medicaid Team

## 2021-03-03 DIAGNOSIS — N760 Acute vaginitis: Secondary | ICD-10-CM | POA: Diagnosis not present

## 2021-03-07 DIAGNOSIS — Z3046 Encounter for surveillance of implantable subdermal contraceptive: Secondary | ICD-10-CM | POA: Diagnosis not present

## 2021-04-11 DIAGNOSIS — L709 Acne, unspecified: Secondary | ICD-10-CM | POA: Diagnosis not present

## 2021-05-01 DIAGNOSIS — S93601A Unspecified sprain of right foot, initial encounter: Secondary | ICD-10-CM | POA: Diagnosis not present

## 2021-05-01 DIAGNOSIS — S93491A Sprain of other ligament of right ankle, initial encounter: Secondary | ICD-10-CM | POA: Diagnosis not present

## 2021-05-02 DIAGNOSIS — N898 Other specified noninflammatory disorders of vagina: Secondary | ICD-10-CM | POA: Diagnosis not present

## 2021-05-02 DIAGNOSIS — R300 Dysuria: Secondary | ICD-10-CM | POA: Diagnosis not present

## 2021-06-22 DIAGNOSIS — Z03818 Encounter for observation for suspected exposure to other biological agents ruled out: Secondary | ICD-10-CM | POA: Diagnosis not present

## 2021-06-22 DIAGNOSIS — Z20822 Contact with and (suspected) exposure to covid-19: Secondary | ICD-10-CM | POA: Diagnosis not present

## 2021-06-30 DIAGNOSIS — B373 Candidiasis of vulva and vagina: Secondary | ICD-10-CM | POA: Diagnosis not present

## 2021-06-30 DIAGNOSIS — N898 Other specified noninflammatory disorders of vagina: Secondary | ICD-10-CM | POA: Diagnosis not present

## 2021-07-04 DIAGNOSIS — L700 Acne vulgaris: Secondary | ICD-10-CM | POA: Diagnosis not present

## 2021-07-12 DIAGNOSIS — Z3202 Encounter for pregnancy test, result negative: Secondary | ICD-10-CM | POA: Diagnosis not present

## 2021-07-12 DIAGNOSIS — Z202 Contact with and (suspected) exposure to infections with a predominantly sexual mode of transmission: Secondary | ICD-10-CM | POA: Diagnosis not present

## 2021-07-12 DIAGNOSIS — N898 Other specified noninflammatory disorders of vagina: Secondary | ICD-10-CM | POA: Diagnosis not present

## 2021-07-12 DIAGNOSIS — N760 Acute vaginitis: Secondary | ICD-10-CM | POA: Diagnosis not present

## 2021-07-12 DIAGNOSIS — N730 Acute parametritis and pelvic cellulitis: Secondary | ICD-10-CM | POA: Diagnosis not present

## 2021-08-12 DIAGNOSIS — N760 Acute vaginitis: Secondary | ICD-10-CM | POA: Diagnosis not present

## 2021-08-12 DIAGNOSIS — N898 Other specified noninflammatory disorders of vagina: Secondary | ICD-10-CM | POA: Diagnosis not present

## 2021-08-12 DIAGNOSIS — B9689 Other specified bacterial agents as the cause of diseases classified elsewhere: Secondary | ICD-10-CM | POA: Diagnosis not present

## 2021-08-31 DIAGNOSIS — N898 Other specified noninflammatory disorders of vagina: Secondary | ICD-10-CM | POA: Diagnosis not present

## 2021-08-31 DIAGNOSIS — Z113 Encounter for screening for infections with a predominantly sexual mode of transmission: Secondary | ICD-10-CM | POA: Diagnosis not present

## 2021-09-13 ENCOUNTER — Encounter (HOSPITAL_COMMUNITY): Payer: Self-pay

## 2021-09-13 ENCOUNTER — Emergency Department (HOSPITAL_COMMUNITY)
Admission: EM | Admit: 2021-09-13 | Discharge: 2021-09-13 | Disposition: A | Discharge status: Home / Self Care | Payer: Medicaid Other | Attending: Emergency Medicine | Admitting: Emergency Medicine

## 2021-09-13 DIAGNOSIS — J101 Influenza due to other identified influenza virus with other respiratory manifestations: Secondary | ICD-10-CM | POA: Diagnosis not present

## 2021-09-13 DIAGNOSIS — Z20822 Contact with and (suspected) exposure to covid-19: Secondary | ICD-10-CM | POA: Diagnosis not present

## 2021-09-13 DIAGNOSIS — J111 Influenza due to unidentified influenza virus with other respiratory manifestations: Secondary | ICD-10-CM

## 2021-09-13 DIAGNOSIS — R059 Cough, unspecified: Secondary | ICD-10-CM | POA: Diagnosis present

## 2021-09-13 LAB — RESP PANEL BY RT-PCR (FLU A&B, COVID) ARPGX2
Influenza A by PCR: NEGATIVE
Influenza B by PCR: NEGATIVE
SARS Coronavirus 2 by RT PCR: NEGATIVE

## 2021-09-13 NOTE — ED Triage Notes (Signed)
Pt presents with c/o flu-like symptoms. Pt reports generalized body aches and headache for 3 days.

## 2021-09-13 NOTE — ED Provider Notes (Signed)
[facility]EMERGENCY DEPT Provider Note   CSN: [id number] Arrival date & time: 09/13/21  0734     History Chief Complaint  Patient presents with   Generalized Body Aches    Eileen Gray is a 21 y.o. female.  Patient presents the emergency department for evaluation of flulike symptoms starting 3 days ago.  Patient describes body aches, chills, cough and shortness of breath, diarrhea (resolved now).  She has had a sore throat.  No documented fevers.  No treatments prior to arrival.  She states that she continues to work but was sent home today.  Onset of symptoms acute.  Course is constant.      Past Medical History:  Diagnosis Date   BV (bacterial vaginosis)     Patient Active Problem List   Diagnosis Date Noted   Nexplanon in place 01/03/2021    History reviewed. No pertinent surgical history.   OB History   No obstetric history on file.     History reviewed. No pertinent family history.  Social History   Tobacco Use   Smoking status: Never   Smokeless tobacco: Never  Substance Use Topics   Alcohol use: Not Currently   Drug use: Yes    Frequency: 1.0 times per week    Types: Marijuana    Comment: every day     Home Medications Prior to Admission medications   Medication Sig Start Date End Date Taking? Authorizing Provider  Etonogestrel (NEXPLANON [place]) Inject into the skin.    [provider]  dicyclomine (BENTYL) 20 MG tablet Take 1 tablet (20 mg total) by mouth 2 (two) times daily. 05/26/20 11/29/20  Robinson, [place] N, PA-C    Allergies    Patient has no known allergies.  Review of Systems   Review of Systems  Constitutional:  Positive for chills and fatigue. Negative for fever.  HENT:  Positive for congestion and sore throat. Negative for ear pain, rhinorrhea and sinus pressure.   Eyes:  Negative for redness.  Respiratory:  Positive for cough and shortness of breath. Negative for wheezing.   Gastrointestinal:   Positive for diarrhea. Negative for abdominal pain, nausea and vomiting.  Genitourinary:  Negative for dysuria.  Musculoskeletal:  Positive for myalgias. Negative for neck stiffness.  Skin:  Negative for rash.  Neurological:  Positive for headaches.  Hematological:  Negative for adenopathy.   Physical Exam Updated Vital Signs BP 113/76 (BP Location: Right Arm)   Pulse 72   Temp 98.5 F (36.9 C) (Oral)   Resp 16   Ht 5\' 2"  (1.575 m)   Wt 46.7 kg   LMP  (LMP Unknown) Comment: implant  SpO2 100%   BMI 18.84 kg/m   Physical Exam Vitals and nursing note reviewed.  Constitutional:      Appearance: She is well-developed.  HENT:     Head: Normocephalic and atraumatic.     Jaw: No trismus.     Right Ear: Tympanic membrane, ear canal and external ear normal.     Left Ear: Tympanic membrane, ear canal and external ear normal.     Nose: Nose normal. No mucosal edema or rhinorrhea.     Mouth/Throat:     Mouth: Mucous membranes are moist. Mucous membranes are not dry. No oral lesions.     Pharynx: Uvula midline. Posterior oropharyngeal erythema present. No oropharyngeal exudate or uvula swelling.     Tonsils: No tonsillar abscesses.  Eyes:     General:  Right eye: No discharge.        Left eye: No discharge.     Conjunctiva/sclera: Conjunctivae normal.  Cardiovascular:     Rate and Rhythm: Normal rate and regular rhythm.     Heart sounds: Normal heart sounds.  Pulmonary:     Effort: Pulmonary effort is normal. No respiratory distress.     Breath sounds: Normal breath sounds. No wheezing or rales.  Abdominal:     Palpations: Abdomen is soft.     Tenderness: There is no abdominal tenderness.  Musculoskeletal:     Cervical back: Normal range of motion and neck supple.  Lymphadenopathy:     Cervical: No cervical adenopathy.  Skin:    General: Skin is warm and dry.  Neurological:     Mental Status: She is alert.  Psychiatric:        Mood and Affect: Mood normal.    ED  Results / Procedures / Treatments   Labs (all labs ordered are listed, but only abnormal results are displayed) Labs Reviewed  RESP PANEL BY RT-PCR (FLU A&B, COVID) [id number]    EKG None  Radiology No results found.  Procedures Procedures   Medications Ordered in ED Medications - No data to display  ED Course  I have reviewed the triage vital signs and the nursing notes.  Pertinent labs & imaging results that were available during my care of the patient were reviewed by me and considered in my medical decision making (see chart for details).  Patient seen and examined. Work-up initiated. Medications ordered.   Vital signs reviewed and are as follows: BP 113/76 (BP Location: Right Arm)   Pulse 72   Temp 98.5 F (36.9 C) (Oral)   Resp 16   Ht 5\' 2"  (1.575 m)   Wt 46.7 kg   LMP  (LMP Unknown) Comment: implant  SpO2 100%   BMI 18.84 kg/m   11:17 AM Patient discharged to home. Encouraged to rest and drink plenty of fluids.  Patient told to return to ED or see their primary doctor if their symptoms worsen, high fever not controlled with tylenol, persistent vomiting, they feel they are dehydrated, or if they have any other concerns.  Patient verbalized understanding and agreed with plan.        MDM Rules/Calculators/A&P                           Patient with symptoms consistent with influenza. Vitals are stable, low-grade fever. No signs of dehydration, tolerating PO's. Lungs are clear. Supportive therapy indicated with return if symptoms worsen. Patient counseled.  Final Clinical Impression(s) / ED Diagnoses Final diagnoses:  Influenza-like illness    Rx / DC Orders ED Discharge Orders     None        , PA-C 09/13/21 1118    13/02/22, MD 09/13/21 [phone]

## 2021-09-13 NOTE — ED Notes (Signed)
An After Visit Summary was printed and given to the patient. Discharge instructions given and no further questions at this time.  

## 2021-09-13 NOTE — Discharge Instructions (Signed)
Please read and follow all provided instructions.  Your diagnoses today include:  1. Influenza-like illness     Tests performed today include: Flu/COVID test: Pending, results will be available in MyChart this afternoon Vital signs. See below for your results today.   Medications prescribed:  None  Take any prescribed medications only as directed.  Home care instructions:  Follow any educational materials contained in this packet. Please continue drinking plenty of fluids. Use over-the-counter cold and flu medications as needed as directed on packaging for symptom relief. You may also use ibuprofen or tylenol as directed on packaging for pain or fever.   BE VERY CAREFUL not to take multiple medicines containing Tylenol (also called acetaminophen). Doing so can lead to an overdose which can damage your liver and cause liver failure and possibly death.   Follow-up instructions: Please follow-up with your primary care provider in the next 3 days for further evaluation of your symptoms.   Return instructions:  Please return to the Emergency Department if you experience worsening symptoms. Please return if you have a high fever greater than 101 degrees not controlled with over-the-counter medications, persistent vomiting and cannot keep down fluids, or worsening trouble breathing. Please return if you have any other emergent concerns.  Additional Information:  Your vital signs today were: BP 113/76 (BP Location: Right Arm)   Pulse 72   Temp 98.5 F (36.9 C) (Oral)   Resp 16   Ht 5\' 2"  (1.575 m)   Wt 46.7 kg   LMP  (LMP Unknown) Comment: implant  SpO2 100%   BMI 18.84 kg/m  If your blood pressure (BP) was elevated above 135/85 this visit, please have this repeated by your doctor within one month.

## 2021-10-09 DIAGNOSIS — F321 Major depressive disorder, single episode, moderate: Secondary | ICD-10-CM | POA: Diagnosis not present

## 2021-10-12 DIAGNOSIS — F321 Major depressive disorder, single episode, moderate: Secondary | ICD-10-CM | POA: Diagnosis not present

## 2021-10-13 DIAGNOSIS — F321 Major depressive disorder, single episode, moderate: Secondary | ICD-10-CM | POA: Diagnosis not present

## 2021-10-14 DIAGNOSIS — F321 Major depressive disorder, single episode, moderate: Secondary | ICD-10-CM | POA: Diagnosis not present

## 2021-10-16 DIAGNOSIS — F321 Major depressive disorder, single episode, moderate: Secondary | ICD-10-CM | POA: Diagnosis not present

## 2021-10-17 DIAGNOSIS — F321 Major depressive disorder, single episode, moderate: Secondary | ICD-10-CM | POA: Diagnosis not present

## 2021-10-18 DIAGNOSIS — F321 Major depressive disorder, single episode, moderate: Secondary | ICD-10-CM | POA: Diagnosis not present

## 2021-10-19 DIAGNOSIS — F321 Major depressive disorder, single episode, moderate: Secondary | ICD-10-CM | POA: Diagnosis not present

## 2021-10-20 DIAGNOSIS — F321 Major depressive disorder, single episode, moderate: Secondary | ICD-10-CM | POA: Diagnosis not present

## 2021-10-23 DIAGNOSIS — F321 Major depressive disorder, single episode, moderate: Secondary | ICD-10-CM | POA: Diagnosis not present

## 2021-10-24 DIAGNOSIS — F321 Major depressive disorder, single episode, moderate: Secondary | ICD-10-CM | POA: Diagnosis not present

## 2021-10-25 DIAGNOSIS — F321 Major depressive disorder, single episode, moderate: Secondary | ICD-10-CM | POA: Diagnosis not present

## 2021-10-26 DIAGNOSIS — F321 Major depressive disorder, single episode, moderate: Secondary | ICD-10-CM | POA: Diagnosis not present

## 2021-10-27 DIAGNOSIS — F321 Major depressive disorder, single episode, moderate: Secondary | ICD-10-CM | POA: Diagnosis not present

## 2021-10-30 DIAGNOSIS — F321 Major depressive disorder, single episode, moderate: Secondary | ICD-10-CM | POA: Diagnosis not present

## 2021-10-31 DIAGNOSIS — F321 Major depressive disorder, single episode, moderate: Secondary | ICD-10-CM | POA: Diagnosis not present

## 2021-11-01 DIAGNOSIS — F321 Major depressive disorder, single episode, moderate: Secondary | ICD-10-CM | POA: Diagnosis not present

## 2021-11-02 DIAGNOSIS — F321 Major depressive disorder, single episode, moderate: Secondary | ICD-10-CM | POA: Diagnosis not present

## 2021-11-03 DIAGNOSIS — F321 Major depressive disorder, single episode, moderate: Secondary | ICD-10-CM | POA: Diagnosis not present

## 2021-11-06 DIAGNOSIS — F321 Major depressive disorder, single episode, moderate: Secondary | ICD-10-CM | POA: Diagnosis not present

## 2021-11-07 DIAGNOSIS — F321 Major depressive disorder, single episode, moderate: Secondary | ICD-10-CM | POA: Diagnosis not present

## 2021-11-08 DIAGNOSIS — F321 Major depressive disorder, single episode, moderate: Secondary | ICD-10-CM | POA: Diagnosis not present

## 2021-11-09 DIAGNOSIS — F321 Major depressive disorder, single episode, moderate: Secondary | ICD-10-CM | POA: Diagnosis not present

## 2021-11-10 DIAGNOSIS — F321 Major depressive disorder, single episode, moderate: Secondary | ICD-10-CM | POA: Diagnosis not present

## 2021-11-13 DIAGNOSIS — F321 Major depressive disorder, single episode, moderate: Secondary | ICD-10-CM | POA: Diagnosis not present

## 2021-11-14 DIAGNOSIS — F321 Major depressive disorder, single episode, moderate: Secondary | ICD-10-CM | POA: Diagnosis not present

## 2021-11-15 DIAGNOSIS — F321 Major depressive disorder, single episode, moderate: Secondary | ICD-10-CM | POA: Diagnosis not present

## 2021-11-16 DIAGNOSIS — F321 Major depressive disorder, single episode, moderate: Secondary | ICD-10-CM | POA: Diagnosis not present

## 2021-11-17 DIAGNOSIS — F321 Major depressive disorder, single episode, moderate: Secondary | ICD-10-CM | POA: Diagnosis not present

## 2021-11-20 DIAGNOSIS — F321 Major depressive disorder, single episode, moderate: Secondary | ICD-10-CM | POA: Diagnosis not present

## 2021-11-21 DIAGNOSIS — F321 Major depressive disorder, single episode, moderate: Secondary | ICD-10-CM | POA: Diagnosis not present

## 2021-11-22 DIAGNOSIS — F321 Major depressive disorder, single episode, moderate: Secondary | ICD-10-CM | POA: Diagnosis not present

## 2021-11-23 DIAGNOSIS — F321 Major depressive disorder, single episode, moderate: Secondary | ICD-10-CM | POA: Diagnosis not present

## 2021-11-24 DIAGNOSIS — F321 Major depressive disorder, single episode, moderate: Secondary | ICD-10-CM | POA: Diagnosis not present

## 2021-11-27 DIAGNOSIS — F321 Major depressive disorder, single episode, moderate: Secondary | ICD-10-CM | POA: Diagnosis not present

## 2021-11-28 DIAGNOSIS — F321 Major depressive disorder, single episode, moderate: Secondary | ICD-10-CM | POA: Diagnosis not present

## 2021-11-29 DIAGNOSIS — F321 Major depressive disorder, single episode, moderate: Secondary | ICD-10-CM | POA: Diagnosis not present

## 2021-11-30 DIAGNOSIS — F321 Major depressive disorder, single episode, moderate: Secondary | ICD-10-CM | POA: Diagnosis not present

## 2021-12-01 DIAGNOSIS — F321 Major depressive disorder, single episode, moderate: Secondary | ICD-10-CM | POA: Diagnosis not present

## 2021-12-04 DIAGNOSIS — F321 Major depressive disorder, single episode, moderate: Secondary | ICD-10-CM | POA: Diagnosis not present

## 2021-12-05 DIAGNOSIS — F321 Major depressive disorder, single episode, moderate: Secondary | ICD-10-CM | POA: Diagnosis not present

## 2021-12-06 DIAGNOSIS — F321 Major depressive disorder, single episode, moderate: Secondary | ICD-10-CM | POA: Diagnosis not present

## 2021-12-07 DIAGNOSIS — F321 Major depressive disorder, single episode, moderate: Secondary | ICD-10-CM | POA: Diagnosis not present

## 2021-12-08 DIAGNOSIS — F321 Major depressive disorder, single episode, moderate: Secondary | ICD-10-CM | POA: Diagnosis not present

## 2021-12-11 DIAGNOSIS — F321 Major depressive disorder, single episode, moderate: Secondary | ICD-10-CM | POA: Diagnosis not present

## 2021-12-12 DIAGNOSIS — F321 Major depressive disorder, single episode, moderate: Secondary | ICD-10-CM | POA: Diagnosis not present

## 2021-12-13 DIAGNOSIS — F321 Major depressive disorder, single episode, moderate: Secondary | ICD-10-CM | POA: Diagnosis not present

## 2021-12-14 DIAGNOSIS — F321 Major depressive disorder, single episode, moderate: Secondary | ICD-10-CM | POA: Diagnosis not present

## 2021-12-15 DIAGNOSIS — F321 Major depressive disorder, single episode, moderate: Secondary | ICD-10-CM | POA: Diagnosis not present

## 2021-12-18 DIAGNOSIS — F321 Major depressive disorder, single episode, moderate: Secondary | ICD-10-CM | POA: Diagnosis not present

## 2021-12-19 DIAGNOSIS — F321 Major depressive disorder, single episode, moderate: Secondary | ICD-10-CM | POA: Diagnosis not present

## 2021-12-20 DIAGNOSIS — F321 Major depressive disorder, single episode, moderate: Secondary | ICD-10-CM | POA: Diagnosis not present

## 2021-12-21 DIAGNOSIS — F321 Major depressive disorder, single episode, moderate: Secondary | ICD-10-CM | POA: Diagnosis not present

## 2021-12-22 DIAGNOSIS — F321 Major depressive disorder, single episode, moderate: Secondary | ICD-10-CM | POA: Diagnosis not present

## 2021-12-25 DIAGNOSIS — F321 Major depressive disorder, single episode, moderate: Secondary | ICD-10-CM | POA: Diagnosis not present

## 2021-12-26 DIAGNOSIS — F321 Major depressive disorder, single episode, moderate: Secondary | ICD-10-CM | POA: Diagnosis not present

## 2021-12-27 DIAGNOSIS — F321 Major depressive disorder, single episode, moderate: Secondary | ICD-10-CM | POA: Diagnosis not present

## 2021-12-28 DIAGNOSIS — F321 Major depressive disorder, single episode, moderate: Secondary | ICD-10-CM | POA: Diagnosis not present

## 2021-12-29 DIAGNOSIS — F321 Major depressive disorder, single episode, moderate: Secondary | ICD-10-CM | POA: Diagnosis not present

## 2022-01-01 DIAGNOSIS — F321 Major depressive disorder, single episode, moderate: Secondary | ICD-10-CM | POA: Diagnosis not present

## 2022-01-02 DIAGNOSIS — F321 Major depressive disorder, single episode, moderate: Secondary | ICD-10-CM | POA: Diagnosis not present

## 2022-01-03 DIAGNOSIS — F321 Major depressive disorder, single episode, moderate: Secondary | ICD-10-CM | POA: Diagnosis not present

## 2022-01-04 DIAGNOSIS — F321 Major depressive disorder, single episode, moderate: Secondary | ICD-10-CM | POA: Diagnosis not present

## 2022-01-05 DIAGNOSIS — F321 Major depressive disorder, single episode, moderate: Secondary | ICD-10-CM | POA: Diagnosis not present

## 2022-01-08 DIAGNOSIS — F321 Major depressive disorder, single episode, moderate: Secondary | ICD-10-CM | POA: Diagnosis not present

## 2022-01-09 DIAGNOSIS — F321 Major depressive disorder, single episode, moderate: Secondary | ICD-10-CM | POA: Diagnosis not present

## 2022-01-10 DIAGNOSIS — F321 Major depressive disorder, single episode, moderate: Secondary | ICD-10-CM | POA: Diagnosis not present

## 2022-01-11 DIAGNOSIS — F321 Major depressive disorder, single episode, moderate: Secondary | ICD-10-CM | POA: Diagnosis not present

## 2022-01-12 DIAGNOSIS — F321 Major depressive disorder, single episode, moderate: Secondary | ICD-10-CM | POA: Diagnosis not present

## 2022-01-13 DIAGNOSIS — F321 Major depressive disorder, single episode, moderate: Secondary | ICD-10-CM | POA: Diagnosis not present

## 2022-01-15 DIAGNOSIS — F321 Major depressive disorder, single episode, moderate: Secondary | ICD-10-CM | POA: Diagnosis not present

## 2022-01-16 DIAGNOSIS — F321 Major depressive disorder, single episode, moderate: Secondary | ICD-10-CM | POA: Diagnosis not present

## 2022-01-17 DIAGNOSIS — F321 Major depressive disorder, single episode, moderate: Secondary | ICD-10-CM | POA: Diagnosis not present

## 2022-01-18 DIAGNOSIS — F321 Major depressive disorder, single episode, moderate: Secondary | ICD-10-CM | POA: Diagnosis not present

## 2022-01-19 ENCOUNTER — Encounter: Payer: Medicaid Other | Admitting: Nurse Practitioner

## 2022-01-19 DIAGNOSIS — F321 Major depressive disorder, single episode, moderate: Secondary | ICD-10-CM | POA: Diagnosis not present

## 2022-01-20 DIAGNOSIS — F321 Major depressive disorder, single episode, moderate: Secondary | ICD-10-CM | POA: Diagnosis not present

## 2022-01-22 DIAGNOSIS — F321 Major depressive disorder, single episode, moderate: Secondary | ICD-10-CM | POA: Diagnosis not present

## 2022-01-23 DIAGNOSIS — F321 Major depressive disorder, single episode, moderate: Secondary | ICD-10-CM | POA: Diagnosis not present

## 2022-01-24 DIAGNOSIS — F321 Major depressive disorder, single episode, moderate: Secondary | ICD-10-CM | POA: Diagnosis not present

## 2022-01-25 DIAGNOSIS — F321 Major depressive disorder, single episode, moderate: Secondary | ICD-10-CM | POA: Diagnosis not present

## 2022-01-26 DIAGNOSIS — F321 Major depressive disorder, single episode, moderate: Secondary | ICD-10-CM | POA: Diagnosis not present

## 2022-01-27 DIAGNOSIS — F321 Major depressive disorder, single episode, moderate: Secondary | ICD-10-CM | POA: Diagnosis not present

## 2022-01-29 DIAGNOSIS — F321 Major depressive disorder, single episode, moderate: Secondary | ICD-10-CM | POA: Diagnosis not present

## 2022-01-30 DIAGNOSIS — F321 Major depressive disorder, single episode, moderate: Secondary | ICD-10-CM | POA: Diagnosis not present

## 2022-01-31 DIAGNOSIS — F321 Major depressive disorder, single episode, moderate: Secondary | ICD-10-CM | POA: Diagnosis not present

## 2022-02-01 DIAGNOSIS — F321 Major depressive disorder, single episode, moderate: Secondary | ICD-10-CM | POA: Diagnosis not present

## 2022-02-02 DIAGNOSIS — F321 Major depressive disorder, single episode, moderate: Secondary | ICD-10-CM | POA: Diagnosis not present

## 2022-02-03 DIAGNOSIS — F321 Major depressive disorder, single episode, moderate: Secondary | ICD-10-CM | POA: Diagnosis not present

## 2022-02-05 DIAGNOSIS — F321 Major depressive disorder, single episode, moderate: Secondary | ICD-10-CM | POA: Diagnosis not present

## 2022-02-06 DIAGNOSIS — F321 Major depressive disorder, single episode, moderate: Secondary | ICD-10-CM | POA: Diagnosis not present

## 2022-02-07 DIAGNOSIS — F321 Major depressive disorder, single episode, moderate: Secondary | ICD-10-CM | POA: Diagnosis not present

## 2022-02-12 DIAGNOSIS — F321 Major depressive disorder, single episode, moderate: Secondary | ICD-10-CM | POA: Diagnosis not present

## 2022-02-13 DIAGNOSIS — F321 Major depressive disorder, single episode, moderate: Secondary | ICD-10-CM | POA: Diagnosis not present

## 2022-02-14 DIAGNOSIS — F321 Major depressive disorder, single episode, moderate: Secondary | ICD-10-CM | POA: Diagnosis not present

## 2022-02-15 DIAGNOSIS — F321 Major depressive disorder, single episode, moderate: Secondary | ICD-10-CM | POA: Diagnosis not present

## 2022-02-16 DIAGNOSIS — F321 Major depressive disorder, single episode, moderate: Secondary | ICD-10-CM | POA: Diagnosis not present

## 2022-02-19 DIAGNOSIS — F321 Major depressive disorder, single episode, moderate: Secondary | ICD-10-CM | POA: Diagnosis not present

## 2022-02-20 DIAGNOSIS — F321 Major depressive disorder, single episode, moderate: Secondary | ICD-10-CM | POA: Diagnosis not present

## 2022-02-21 DIAGNOSIS — F321 Major depressive disorder, single episode, moderate: Secondary | ICD-10-CM | POA: Diagnosis not present

## 2022-02-22 DIAGNOSIS — F321 Major depressive disorder, single episode, moderate: Secondary | ICD-10-CM | POA: Diagnosis not present

## 2022-02-23 DIAGNOSIS — F321 Major depressive disorder, single episode, moderate: Secondary | ICD-10-CM | POA: Diagnosis not present

## 2022-02-26 DIAGNOSIS — F321 Major depressive disorder, single episode, moderate: Secondary | ICD-10-CM | POA: Diagnosis not present

## 2022-02-27 DIAGNOSIS — F321 Major depressive disorder, single episode, moderate: Secondary | ICD-10-CM | POA: Diagnosis not present

## 2022-02-28 DIAGNOSIS — F321 Major depressive disorder, single episode, moderate: Secondary | ICD-10-CM | POA: Diagnosis not present

## 2022-03-01 DIAGNOSIS — F321 Major depressive disorder, single episode, moderate: Secondary | ICD-10-CM | POA: Diagnosis not present

## 2022-03-02 DIAGNOSIS — F321 Major depressive disorder, single episode, moderate: Secondary | ICD-10-CM | POA: Diagnosis not present

## 2022-03-05 DIAGNOSIS — F321 Major depressive disorder, single episode, moderate: Secondary | ICD-10-CM | POA: Diagnosis not present

## 2022-03-06 DIAGNOSIS — F321 Major depressive disorder, single episode, moderate: Secondary | ICD-10-CM | POA: Diagnosis not present

## 2022-03-07 DIAGNOSIS — F321 Major depressive disorder, single episode, moderate: Secondary | ICD-10-CM | POA: Diagnosis not present

## 2022-03-08 DIAGNOSIS — F321 Major depressive disorder, single episode, moderate: Secondary | ICD-10-CM | POA: Diagnosis not present

## 2022-03-09 DIAGNOSIS — F321 Major depressive disorder, single episode, moderate: Secondary | ICD-10-CM | POA: Diagnosis not present

## 2022-03-13 DIAGNOSIS — F321 Major depressive disorder, single episode, moderate: Secondary | ICD-10-CM | POA: Diagnosis not present

## 2022-03-14 DIAGNOSIS — F321 Major depressive disorder, single episode, moderate: Secondary | ICD-10-CM | POA: Diagnosis not present

## 2022-03-15 DIAGNOSIS — F321 Major depressive disorder, single episode, moderate: Secondary | ICD-10-CM | POA: Diagnosis not present

## 2022-03-16 DIAGNOSIS — F321 Major depressive disorder, single episode, moderate: Secondary | ICD-10-CM | POA: Diagnosis not present

## 2022-03-17 DIAGNOSIS — F321 Major depressive disorder, single episode, moderate: Secondary | ICD-10-CM | POA: Diagnosis not present

## 2022-03-19 DIAGNOSIS — F321 Major depressive disorder, single episode, moderate: Secondary | ICD-10-CM | POA: Diagnosis not present

## 2022-03-20 DIAGNOSIS — F321 Major depressive disorder, single episode, moderate: Secondary | ICD-10-CM | POA: Diagnosis not present

## 2022-03-21 DIAGNOSIS — F321 Major depressive disorder, single episode, moderate: Secondary | ICD-10-CM | POA: Diagnosis not present

## 2022-03-22 DIAGNOSIS — F321 Major depressive disorder, single episode, moderate: Secondary | ICD-10-CM | POA: Diagnosis not present

## 2022-03-23 DIAGNOSIS — F321 Major depressive disorder, single episode, moderate: Secondary | ICD-10-CM | POA: Diagnosis not present

## 2022-03-24 DIAGNOSIS — F321 Major depressive disorder, single episode, moderate: Secondary | ICD-10-CM | POA: Diagnosis not present

## 2022-03-25 DIAGNOSIS — F321 Major depressive disorder, single episode, moderate: Secondary | ICD-10-CM | POA: Diagnosis not present

## 2022-03-26 DIAGNOSIS — F321 Major depressive disorder, single episode, moderate: Secondary | ICD-10-CM | POA: Diagnosis not present

## 2022-03-27 DIAGNOSIS — F321 Major depressive disorder, single episode, moderate: Secondary | ICD-10-CM | POA: Diagnosis not present

## 2022-03-28 DIAGNOSIS — F321 Major depressive disorder, single episode, moderate: Secondary | ICD-10-CM | POA: Diagnosis not present

## 2022-03-29 DIAGNOSIS — F321 Major depressive disorder, single episode, moderate: Secondary | ICD-10-CM | POA: Diagnosis not present

## 2022-03-30 DIAGNOSIS — F321 Major depressive disorder, single episode, moderate: Secondary | ICD-10-CM | POA: Diagnosis not present

## 2022-03-31 DIAGNOSIS — F321 Major depressive disorder, single episode, moderate: Secondary | ICD-10-CM | POA: Diagnosis not present

## 2022-04-02 DIAGNOSIS — F321 Major depressive disorder, single episode, moderate: Secondary | ICD-10-CM | POA: Diagnosis not present

## 2022-04-03 DIAGNOSIS — F321 Major depressive disorder, single episode, moderate: Secondary | ICD-10-CM | POA: Diagnosis not present

## 2022-04-04 DIAGNOSIS — F321 Major depressive disorder, single episode, moderate: Secondary | ICD-10-CM | POA: Diagnosis not present

## 2022-04-05 DIAGNOSIS — F321 Major depressive disorder, single episode, moderate: Secondary | ICD-10-CM | POA: Diagnosis not present

## 2022-04-06 DIAGNOSIS — N898 Other specified noninflammatory disorders of vagina: Secondary | ICD-10-CM | POA: Diagnosis not present

## 2022-04-06 DIAGNOSIS — F321 Major depressive disorder, single episode, moderate: Secondary | ICD-10-CM | POA: Diagnosis not present

## 2022-04-06 DIAGNOSIS — Z3202 Encounter for pregnancy test, result negative: Secondary | ICD-10-CM | POA: Diagnosis not present

## 2022-04-06 DIAGNOSIS — R300 Dysuria: Secondary | ICD-10-CM | POA: Diagnosis not present

## 2022-04-09 DIAGNOSIS — F321 Major depressive disorder, single episode, moderate: Secondary | ICD-10-CM | POA: Diagnosis not present

## 2022-04-10 DIAGNOSIS — F321 Major depressive disorder, single episode, moderate: Secondary | ICD-10-CM | POA: Diagnosis not present

## 2022-04-11 DIAGNOSIS — F321 Major depressive disorder, single episode, moderate: Secondary | ICD-10-CM | POA: Diagnosis not present

## 2022-04-12 DIAGNOSIS — F321 Major depressive disorder, single episode, moderate: Secondary | ICD-10-CM | POA: Diagnosis not present

## 2022-04-13 DIAGNOSIS — F321 Major depressive disorder, single episode, moderate: Secondary | ICD-10-CM | POA: Diagnosis not present

## 2022-04-14 DIAGNOSIS — F321 Major depressive disorder, single episode, moderate: Secondary | ICD-10-CM | POA: Diagnosis not present

## 2022-04-16 DIAGNOSIS — F321 Major depressive disorder, single episode, moderate: Secondary | ICD-10-CM | POA: Diagnosis not present

## 2022-04-17 DIAGNOSIS — F321 Major depressive disorder, single episode, moderate: Secondary | ICD-10-CM | POA: Diagnosis not present

## 2022-04-18 DIAGNOSIS — F321 Major depressive disorder, single episode, moderate: Secondary | ICD-10-CM | POA: Diagnosis not present

## 2022-04-19 DIAGNOSIS — F321 Major depressive disorder, single episode, moderate: Secondary | ICD-10-CM | POA: Diagnosis not present

## 2022-04-20 DIAGNOSIS — F321 Major depressive disorder, single episode, moderate: Secondary | ICD-10-CM | POA: Diagnosis not present

## 2022-04-21 DIAGNOSIS — F321 Major depressive disorder, single episode, moderate: Secondary | ICD-10-CM | POA: Diagnosis not present

## 2022-04-23 DIAGNOSIS — F321 Major depressive disorder, single episode, moderate: Secondary | ICD-10-CM | POA: Diagnosis not present

## 2022-04-24 DIAGNOSIS — F321 Major depressive disorder, single episode, moderate: Secondary | ICD-10-CM | POA: Diagnosis not present

## 2022-04-25 DIAGNOSIS — F321 Major depressive disorder, single episode, moderate: Secondary | ICD-10-CM | POA: Diagnosis not present

## 2022-04-26 DIAGNOSIS — F321 Major depressive disorder, single episode, moderate: Secondary | ICD-10-CM | POA: Diagnosis not present

## 2022-04-27 DIAGNOSIS — F321 Major depressive disorder, single episode, moderate: Secondary | ICD-10-CM | POA: Diagnosis not present

## 2022-04-28 DIAGNOSIS — F321 Major depressive disorder, single episode, moderate: Secondary | ICD-10-CM | POA: Diagnosis not present

## 2022-04-30 DIAGNOSIS — F321 Major depressive disorder, single episode, moderate: Secondary | ICD-10-CM | POA: Diagnosis not present

## 2022-05-01 DIAGNOSIS — F321 Major depressive disorder, single episode, moderate: Secondary | ICD-10-CM | POA: Diagnosis not present

## 2022-05-02 DIAGNOSIS — F321 Major depressive disorder, single episode, moderate: Secondary | ICD-10-CM | POA: Diagnosis not present

## 2022-05-03 DIAGNOSIS — F321 Major depressive disorder, single episode, moderate: Secondary | ICD-10-CM | POA: Diagnosis not present

## 2022-05-04 DIAGNOSIS — F321 Major depressive disorder, single episode, moderate: Secondary | ICD-10-CM | POA: Diagnosis not present

## 2022-05-05 DIAGNOSIS — F321 Major depressive disorder, single episode, moderate: Secondary | ICD-10-CM | POA: Diagnosis not present

## 2022-05-07 DIAGNOSIS — F321 Major depressive disorder, single episode, moderate: Secondary | ICD-10-CM | POA: Diagnosis not present

## 2022-05-08 DIAGNOSIS — F321 Major depressive disorder, single episode, moderate: Secondary | ICD-10-CM | POA: Diagnosis not present

## 2022-05-09 DIAGNOSIS — F321 Major depressive disorder, single episode, moderate: Secondary | ICD-10-CM | POA: Diagnosis not present

## 2022-05-10 DIAGNOSIS — F321 Major depressive disorder, single episode, moderate: Secondary | ICD-10-CM | POA: Diagnosis not present

## 2022-05-11 DIAGNOSIS — F321 Major depressive disorder, single episode, moderate: Secondary | ICD-10-CM | POA: Diagnosis not present

## 2022-05-12 DIAGNOSIS — F321 Major depressive disorder, single episode, moderate: Secondary | ICD-10-CM | POA: Diagnosis not present

## 2022-05-14 DIAGNOSIS — F321 Major depressive disorder, single episode, moderate: Secondary | ICD-10-CM | POA: Diagnosis not present

## 2022-05-15 DIAGNOSIS — F321 Major depressive disorder, single episode, moderate: Secondary | ICD-10-CM | POA: Diagnosis not present

## 2022-05-16 DIAGNOSIS — F321 Major depressive disorder, single episode, moderate: Secondary | ICD-10-CM | POA: Diagnosis not present

## 2022-05-17 DIAGNOSIS — F321 Major depressive disorder, single episode, moderate: Secondary | ICD-10-CM | POA: Diagnosis not present

## 2022-05-18 DIAGNOSIS — F321 Major depressive disorder, single episode, moderate: Secondary | ICD-10-CM | POA: Diagnosis not present

## 2022-05-19 DIAGNOSIS — F321 Major depressive disorder, single episode, moderate: Secondary | ICD-10-CM | POA: Diagnosis not present

## 2022-05-21 DIAGNOSIS — F321 Major depressive disorder, single episode, moderate: Secondary | ICD-10-CM | POA: Diagnosis not present

## 2022-05-22 DIAGNOSIS — F321 Major depressive disorder, single episode, moderate: Secondary | ICD-10-CM | POA: Diagnosis not present

## 2022-05-23 DIAGNOSIS — F321 Major depressive disorder, single episode, moderate: Secondary | ICD-10-CM | POA: Diagnosis not present

## 2022-05-24 DIAGNOSIS — F321 Major depressive disorder, single episode, moderate: Secondary | ICD-10-CM | POA: Diagnosis not present

## 2022-05-25 DIAGNOSIS — F321 Major depressive disorder, single episode, moderate: Secondary | ICD-10-CM | POA: Diagnosis not present

## 2022-05-26 DIAGNOSIS — F321 Major depressive disorder, single episode, moderate: Secondary | ICD-10-CM | POA: Diagnosis not present

## 2022-05-28 DIAGNOSIS — F321 Major depressive disorder, single episode, moderate: Secondary | ICD-10-CM | POA: Diagnosis not present

## 2022-05-29 DIAGNOSIS — F321 Major depressive disorder, single episode, moderate: Secondary | ICD-10-CM | POA: Diagnosis not present

## 2022-05-30 DIAGNOSIS — F321 Major depressive disorder, single episode, moderate: Secondary | ICD-10-CM | POA: Diagnosis not present

## 2022-05-31 DIAGNOSIS — F321 Major depressive disorder, single episode, moderate: Secondary | ICD-10-CM | POA: Diagnosis not present

## 2022-06-01 DIAGNOSIS — F321 Major depressive disorder, single episode, moderate: Secondary | ICD-10-CM | POA: Diagnosis not present

## 2022-06-02 DIAGNOSIS — F321 Major depressive disorder, single episode, moderate: Secondary | ICD-10-CM | POA: Diagnosis not present

## 2022-06-04 DIAGNOSIS — F321 Major depressive disorder, single episode, moderate: Secondary | ICD-10-CM | POA: Diagnosis not present

## 2022-06-05 DIAGNOSIS — F321 Major depressive disorder, single episode, moderate: Secondary | ICD-10-CM | POA: Diagnosis not present

## 2022-06-06 DIAGNOSIS — F321 Major depressive disorder, single episode, moderate: Secondary | ICD-10-CM | POA: Diagnosis not present

## 2022-06-07 DIAGNOSIS — F321 Major depressive disorder, single episode, moderate: Secondary | ICD-10-CM | POA: Diagnosis not present

## 2022-06-08 DIAGNOSIS — F321 Major depressive disorder, single episode, moderate: Secondary | ICD-10-CM | POA: Diagnosis not present

## 2022-06-09 DIAGNOSIS — F321 Major depressive disorder, single episode, moderate: Secondary | ICD-10-CM | POA: Diagnosis not present

## 2022-06-11 DIAGNOSIS — Z202 Contact with and (suspected) exposure to infections with a predominantly sexual mode of transmission: Secondary | ICD-10-CM | POA: Diagnosis not present

## 2022-06-11 DIAGNOSIS — F321 Major depressive disorder, single episode, moderate: Secondary | ICD-10-CM | POA: Diagnosis not present

## 2022-06-11 DIAGNOSIS — N898 Other specified noninflammatory disorders of vagina: Secondary | ICD-10-CM | POA: Diagnosis not present

## 2022-06-11 DIAGNOSIS — N760 Acute vaginitis: Secondary | ICD-10-CM | POA: Diagnosis not present

## 2022-06-11 DIAGNOSIS — Z8619 Personal history of other infectious and parasitic diseases: Secondary | ICD-10-CM | POA: Diagnosis not present

## 2022-06-12 DIAGNOSIS — F321 Major depressive disorder, single episode, moderate: Secondary | ICD-10-CM | POA: Diagnosis not present

## 2022-06-13 DIAGNOSIS — F321 Major depressive disorder, single episode, moderate: Secondary | ICD-10-CM | POA: Diagnosis not present

## 2022-06-14 DIAGNOSIS — F321 Major depressive disorder, single episode, moderate: Secondary | ICD-10-CM | POA: Diagnosis not present

## 2022-06-15 DIAGNOSIS — F321 Major depressive disorder, single episode, moderate: Secondary | ICD-10-CM | POA: Diagnosis not present

## 2022-06-16 DIAGNOSIS — F321 Major depressive disorder, single episode, moderate: Secondary | ICD-10-CM | POA: Diagnosis not present

## 2023-09-10 DIAGNOSIS — R6889 Other general symptoms and signs: Secondary | ICD-10-CM | POA: Diagnosis not present

## 2023-09-10 DIAGNOSIS — Z1329 Encounter for screening for other suspected endocrine disorder: Secondary | ICD-10-CM | POA: Diagnosis not present

## 2023-09-10 DIAGNOSIS — Z136 Encounter for screening for cardiovascular disorders: Secondary | ICD-10-CM | POA: Diagnosis not present

## 2023-09-10 DIAGNOSIS — N898 Other specified noninflammatory disorders of vagina: Secondary | ICD-10-CM | POA: Diagnosis not present
